# Patient Record
Sex: Male | Born: 1965 | Race: White | Hispanic: No | Marital: Single | Smoking: Never smoker
Health system: Southern US, Community
[De-identification: ages and names within clinical notes are randomized; demographics above are authoritative.]

## PROBLEM LIST (undated history)

## (undated) HISTORY — PX: OTHER SURGICAL HISTORY: SHX169

---

## 2014-06-07 ENCOUNTER — Emergency Department (HOSPITAL_COMMUNITY)
Admission: EM | Admit: 2014-06-07 | Discharge: 2014-06-07 | Disposition: A | Discharge status: Home / Self Care | Payer: No Typology Code available for payment source | Attending: Emergency Medicine | Admitting: Emergency Medicine

## 2014-06-07 ENCOUNTER — Encounter (HOSPITAL_COMMUNITY): Payer: Self-pay | Admitting: Emergency Medicine

## 2014-06-07 ENCOUNTER — Emergency Department (HOSPITAL_COMMUNITY): Payer: No Typology Code available for payment source

## 2014-06-07 DIAGNOSIS — S199XXA Unspecified injury of neck, initial encounter: Secondary | ICD-10-CM

## 2014-06-07 DIAGNOSIS — Y9389 Activity, other specified: Secondary | ICD-10-CM | POA: Diagnosis not present

## 2014-06-07 DIAGNOSIS — S0990XA Unspecified injury of head, initial encounter: Secondary | ICD-10-CM | POA: Diagnosis present

## 2014-06-07 DIAGNOSIS — S0993XA Unspecified injury of face, initial encounter: Secondary | ICD-10-CM | POA: Diagnosis not present

## 2014-06-07 DIAGNOSIS — W1809XA Striking against other object with subsequent fall, initial encounter: Secondary | ICD-10-CM | POA: Diagnosis not present

## 2014-06-07 DIAGNOSIS — Y929 Unspecified place or not applicable: Secondary | ICD-10-CM | POA: Diagnosis not present

## 2014-06-07 DIAGNOSIS — R413 Other amnesia: Secondary | ICD-10-CM | POA: Diagnosis not present

## 2014-06-07 DIAGNOSIS — W19XXXA Unspecified fall, initial encounter: Secondary | ICD-10-CM

## 2014-06-07 DIAGNOSIS — Z79899 Other long term (current) drug therapy: Secondary | ICD-10-CM | POA: Insufficient documentation

## 2014-06-07 DIAGNOSIS — S060X9A Concussion with loss of consciousness of unspecified duration, initial encounter: Secondary | ICD-10-CM | POA: Diagnosis not present

## 2014-06-07 MED ORDER — HYDROMORPHONE HCL PF 1 MG/ML IJ SOLN
1.0000 mg | Freq: Once | INTRAMUSCULAR | Status: AC
  Administered 2014-06-07: 1 mg via INTRAMUSCULAR
  Filled 2014-06-07: qty 1

## 2014-06-07 NOTE — Discharge Instructions (Signed)
As discussed, your evaluation today has been largely reassuring.  But, it is important that you monitor your condition carefully, and do not hesitate to return to the ED if you develop new, or concerning changes in your condition. ? ?Otherwise, please follow-up with your physician for appropriate ongoing care. ? ?

## 2014-06-07 NOTE — ED Notes (Signed)
Patient with no complaints at this time. Respirations even and unlabored. Skin warm/dry. Discharge instructions reviewed with patient at this time. Patient given opportunity to voice concerns/ask questions. Patient discharged at this time and left Emergency Department with steady gait.   

## 2014-06-07 NOTE — ED Provider Notes (Signed)
CSN: [id number]     Arrival date & time 06/07/14  1743 History   First MD Initiated Contact with Patient 06/07/14 1750     Chief Complaint  Patient presents with  . Fall     (Consider location/radiation/quality/duration/timing/severity/associated sxs/prior Treatment) HPI Patient presents after a fall with pain in the back of his head, mid and the left neck. Patient was in his usual state of health, when he fell from a broken chair, striking his head against a pipe. There was loss of consciousness, and subsequent confusion upon awakening, but no visual loss, vomiting or or incontinence or pain in other areas. Patient's pain is severe, worse with attempts at motion. No new weakness in any extremity. 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 History reviewed. No pertinent past medical history. Past Surgical History  Procedure Laterality Date  . Birth defects     No family history on file. History  Substance Use Topics  . Smoking status: Never Smoker   . Smokeless tobacco: Not on file  . Alcohol Use: No    Review of Systems Review of systems otherwise unremarkable   Allergies  Review of patient's allergies indicates no known allergies.  Home Medications   Prior to Admission medications   Medication Sig Start Date End Date Taking? Authorizing Provider  oxycodone (OXY-IR) 5 MG capsule Take 5 mg by mouth 3 (three) times daily.   Yes Historical Provider, MD   BP 130/89  Pulse 89  Temp(Src) 98.6 F (37 C) (Oral)  Resp 18  Ht  (1.422 m)  Wt 175 lb (79.379 kg)  BMI 39.26 kg/m2  SpO2 96% Physical Exam  Nursing note and vitals reviewed. Constitutional: He is oriented to person, place, and time. He appears well-developed. No distress.  HENT:  Head: Normocephalic and atraumatic.  Eyes: Conjunctivae and EOM are normal.  Neck:  Cervical collar in place.  When removed from the  patient's point tenderness about the mid to lower cervical spine, left lateral cervical area  Cardiovascular: Normal rate and regular rhythm.   Pulmonary/Chest: Effort normal. No stridor. No respiratory distress.  Abdominal: He exhibits no distension.  Musculoskeletal: He exhibits no edema.  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 Patient notes no changes from baseline.   Neurological: He is alert and oriented to person, place, and time. No cranial nerve deficit.  Skin: Skin is warm and dry.  Psychiatric: He has a normal mood and affect.    ED Course  Procedures (including critical care time) Labs Review Labs Reviewed - No data to display  Imaging Review Ct Head Wo Contrast  06/07/2014   CLINICAL DATA:  Fall.  EXAM: CT HEAD WITHOUT CONTRAST  CT CERVICAL SPINE WITHOUT CONTRAST  TECHNIQUE: Multidetector CT imaging of the head and cervical spine was performed following the standard protocol without intravenous contrast. Multiplanar CT image reconstructions of the cervical spine were also generated.  COMPARISON:  None.  FINDINGS: CT HEAD FINDINGS  The ventricles, cisterns and other CSF spaces are within normal. There is no mass, mass effect, shift of midline structures or acute hemorrhage. There is no evidence of acute infarction. Remaining bones and soft tissues are unremarkable.  CT CERVICAL SPINE FINDINGS  Exam demonstrates mild reversal of normal cervical lordosis. There are mild degenerative changes throughout the cervical spine. There is minimal disc space narrowing at the C3-4 and C5-6 levels. Prevertebral  soft tissues as well as a atlantoaxial articulation are within normal. There is mild uncovertebral joint spurring. There is no acute fracture or subluxation. Remainder of the exam is unremarkable.  IMPRESSION: No acute intracranial findings.  No acute cervical spine injury.  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   Electronically Signed   By: Elberta Fortis M.D.   On: 06/07/2014 18:53   Ct Cervical Spine Wo Contrast  06/07/2014   CLINICAL DATA:  Fall.  EXAM: CT HEAD WITHOUT CONTRAST  CT CERVICAL SPINE WITHOUT CONTRAST  TECHNIQUE: Multidetector CT imaging of the head and cervical spine was performed following the standard protocol without intravenous contrast. Multiplanar CT image reconstructions of the cervical spine were also generated.  COMPARISON:  None.  FINDINGS: CT HEAD FINDINGS  The ventricles, cisterns and other CSF spaces are within normal. There is no mass, mass effect, shift of midline structures or acute hemorrhage. There is no evidence of acute infarction. Remaining bones and soft tissues are unremarkable.  CT CERVICAL SPINE FINDINGS  Exam demonstrates mild reversal of normal cervical lordosis. There are mild degenerative changes throughout the cervical spine. There is minimal disc space narrowing at the C3-4 and C5-6 levels. Prevertebral soft tissues as well as a atlantoaxial articulation are within normal. There is mild uncovertebral joint spurring. There is no acute fracture or subluxation. Remainder of the exam is unremarkable.  IMPRESSION: No acute intracranial findings.  No acute cervical spine injury.  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   Electronically Signed   By: Elberta Fortis M.D.   On: 06/07/2014 18:53   Exam the patient is in no distress.  MDM   Patient presents after a mechanical fall with amnesia, head and neck pain. Patient Windell Moulding analgesia here, has no decompensation, remained hemodynamically stable. Patient was discharged in stable condition with cryotherapy, analgesia, as he is already receiving narcotics.    Gerhard Munch, MD 06/07/14 [phone]

## 2014-06-07 NOTE — ED Notes (Signed)
ems reports pt was at a wedding sitting in a rocking chair, reports rocking chair broke and pt fell backwards hitting back of head and neck on a pipe.  Pt c/o pain to back of head and neck.  Denies any LOC.  Reports fell approx 2 or 3 feet.  EMS put c collar on pt.

## 2015-09-30 IMAGING — CT CT CERVICAL SPINE W/O CM
3 of 5 series · 12 of 33 positions shown, 14 images · non-contrast
Comparison: None.

CLINICAL DATA: Fall.

EXAM:
CT HEAD WITHOUT CONTRAST
CT CERVICAL SPINE WITHOUT CONTRAST
TECHNIQUE: Multidetector CT imaging of the head and cervical spine was
performed following the standard protocol without intravenous
contrast. Multiplanar CT image reconstructions of the cervical spine
were also generated.

[Series 5: cervical st 2.0 b31s · axial · 0.30mm/px · z∈[+113,+237]mm · 4 of 104 slices shown, 5 images]
[im 21/104  soft-tissue]
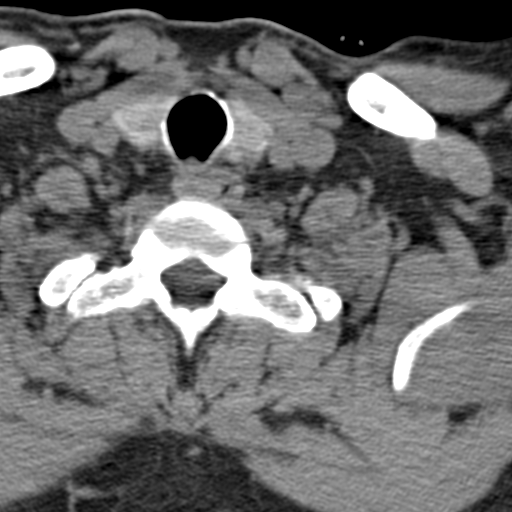
[im 21/104  bone]
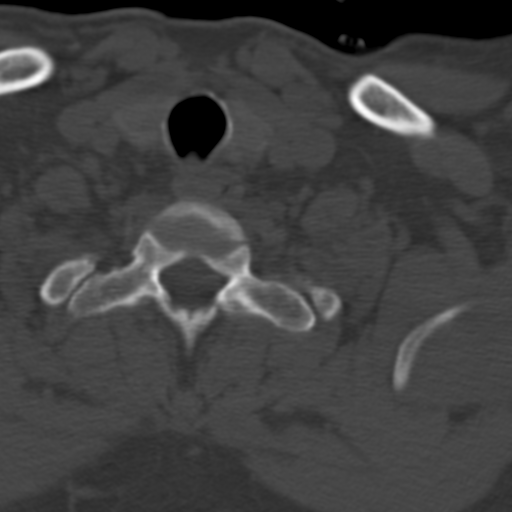
[im 42/104  bone]
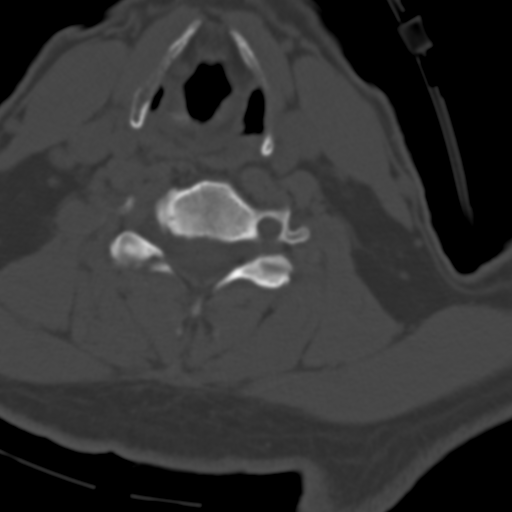
[im 62/104  bone]
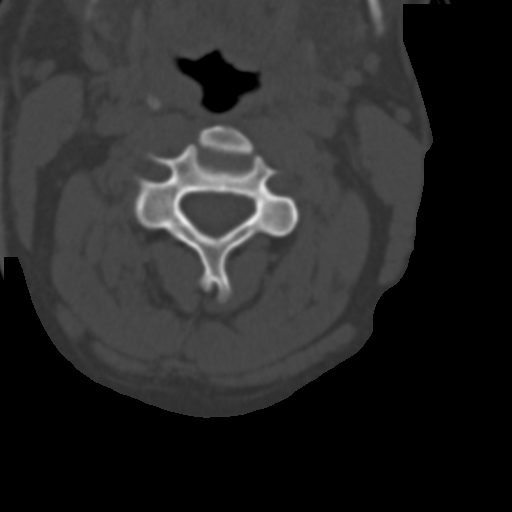
[im 83/104  bone]
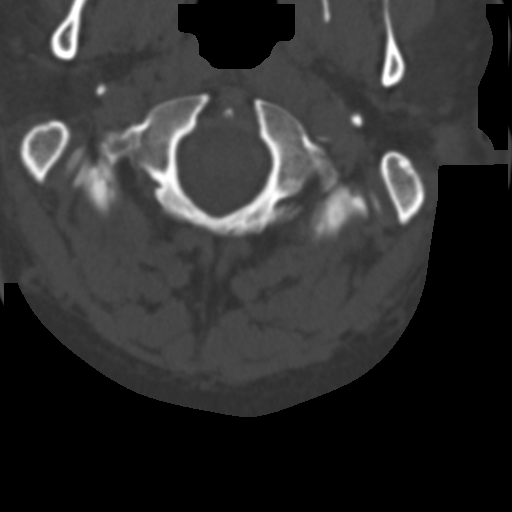

[Series 7: sagittal bone 2.0 · sagittal · 0.29mm/px · 5 of 69 slices shown, 6 images]
[im 23/69  bone]
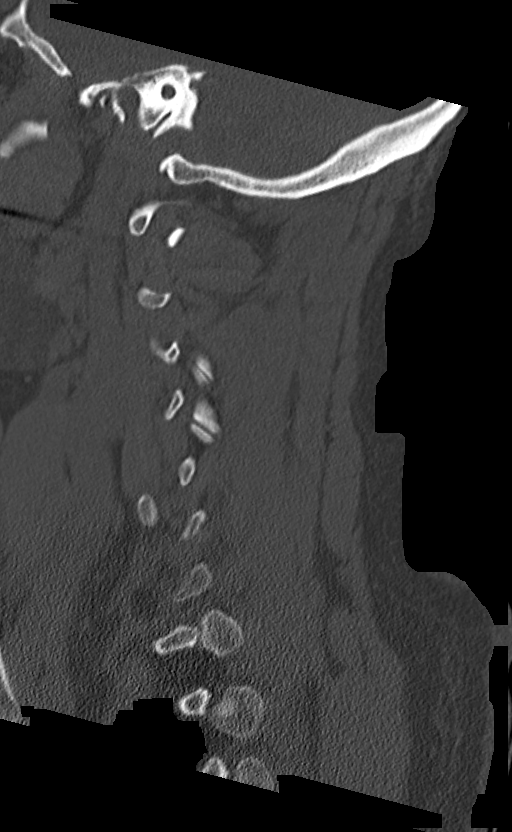
[im 29/69  bone]
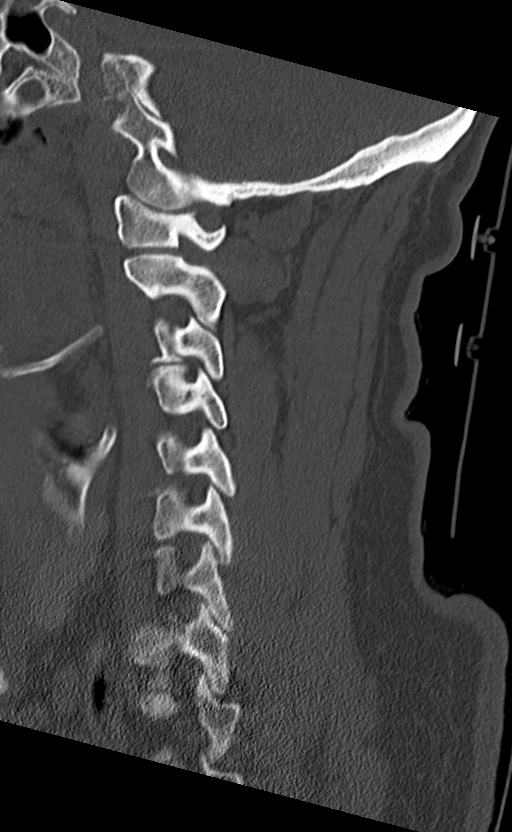
[im 35/69  soft-tissue]
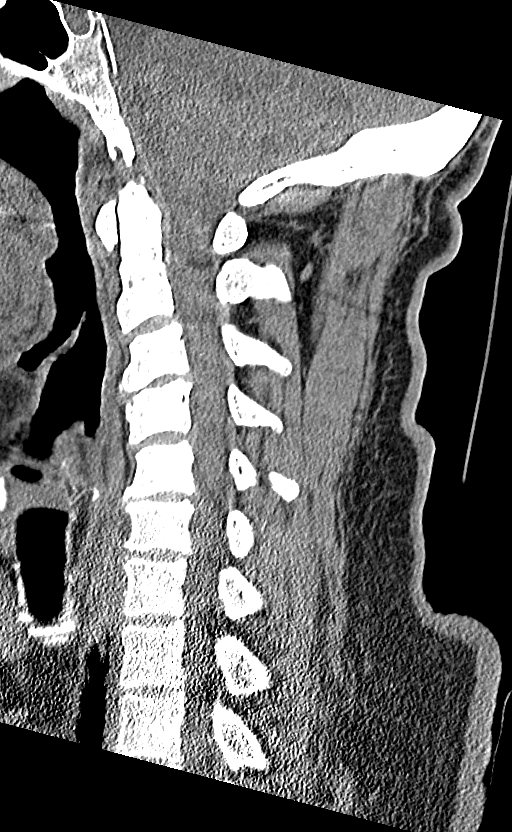
[im 35/69  bone]
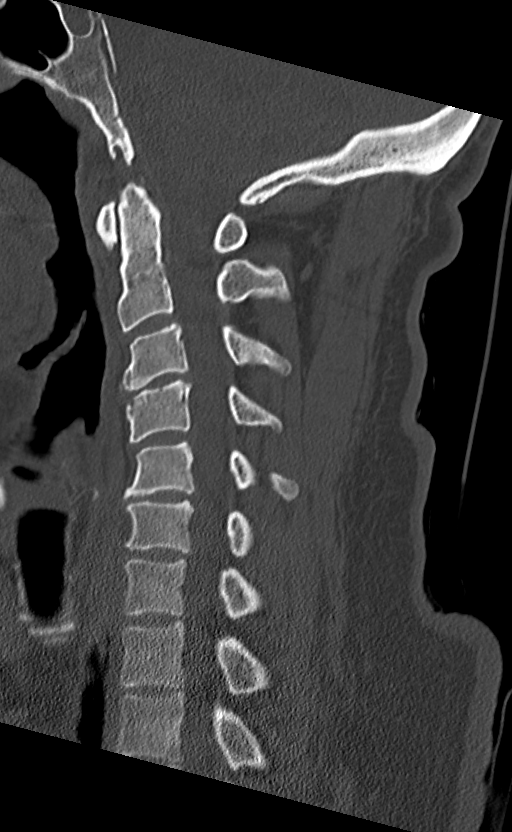
[im 40/69  bone]
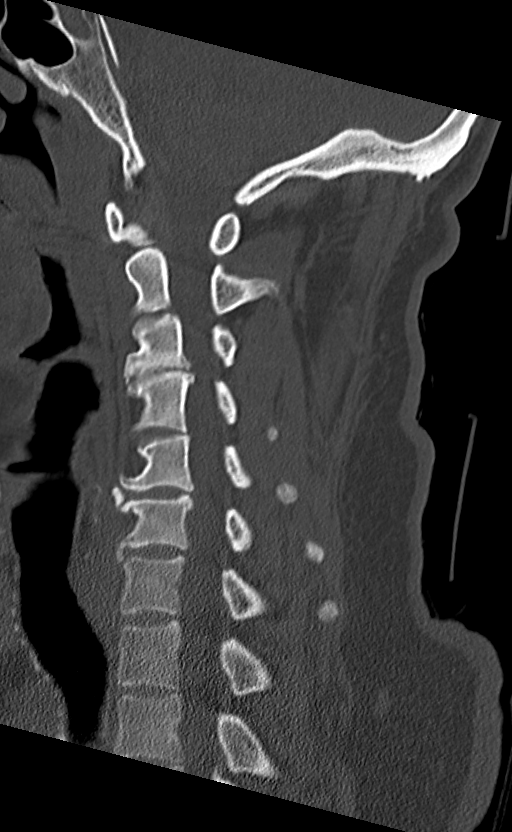
[im 46/69  bone]
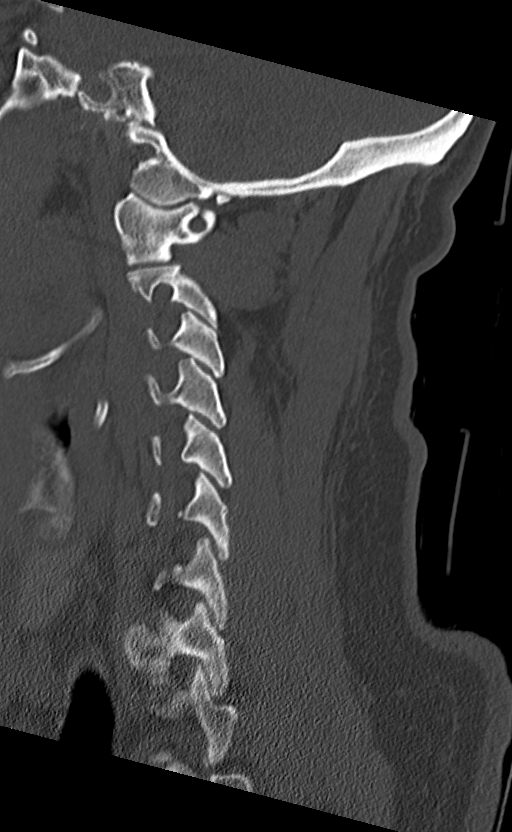

[Series 8: coronal bone 2.0 · coronal · 0.29mm/px · 3 of 65 slices shown]
[im 13/65  bone]
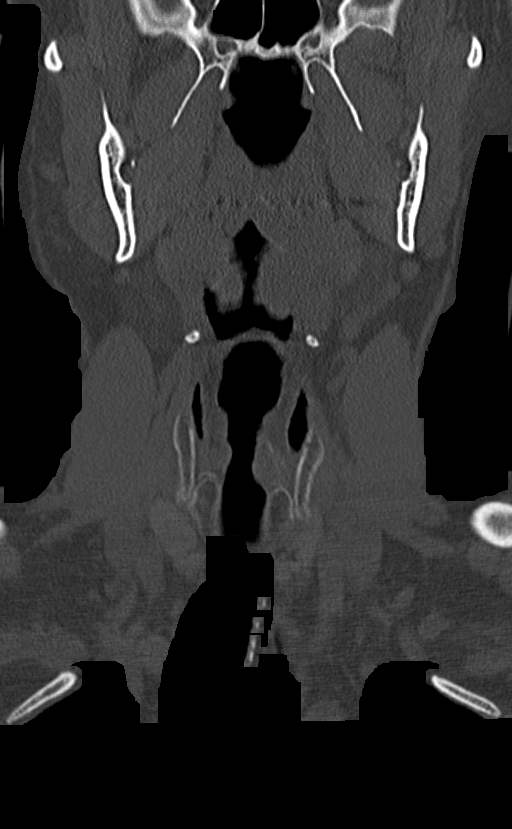
[im 26/65  bone]
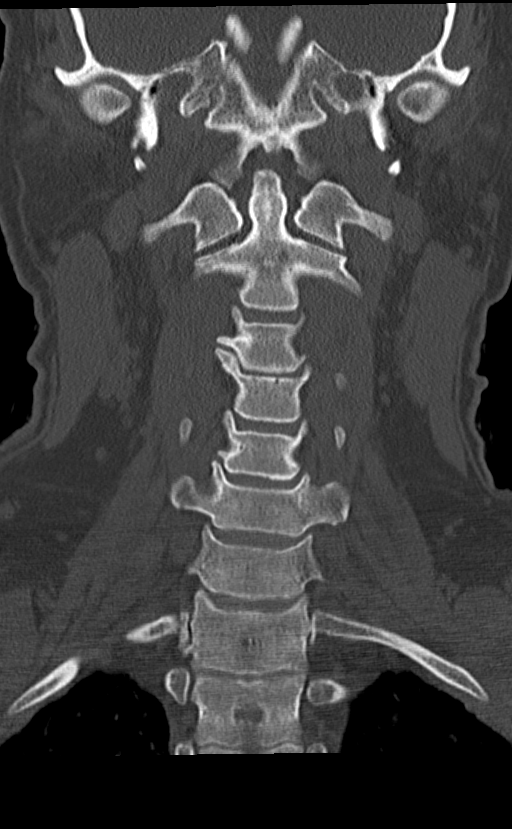
[im 39/65  bone]
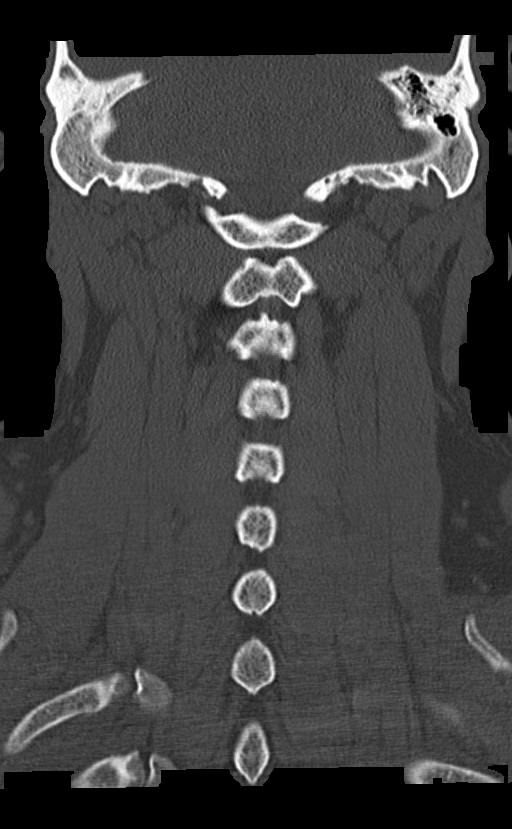

[12 of 33 positions shown; findings below may reference images not displayed]

FINDINGS: CT HEAD FINDINGS

The ventricles, cisterns and other CSF spaces are within normal.
There is no mass, mass effect, shift of midline structures or acute
hemorrhage. There is no evidence of acute infarction. Remaining
bones and soft tissues are unremarkable.

CT CERVICAL SPINE FINDINGS

Exam demonstrates mild reversal of normal cervical lordosis. There
are mild degenerative changes throughout the cervical spine. There
is minimal disc space narrowing at the C3-4 and C5-6 levels.
Prevertebral soft tissues as well as a atlantoaxial articulation are
within normal. There is mild uncovertebral joint spurring. There is
no acute fracture or subluxation. Remainder of the exam is
unremarkable.
IMPRESSION: No acute intracranial findings.

No acute cervical spine injury.

Mild spondylosis of the cervical spine with disc disease at the C3-4
and C5-6 levels.
